# Patient Record
Sex: Male | Born: 1995 | Race: Black or African American | Hispanic: No | Smoking: Current every day smoker
Health system: Southern US, Community
[De-identification: ages and names within clinical notes are randomized; demographics above are authoritative.]

## PROBLEM LIST (undated history)

## (undated) DIAGNOSIS — N451 Epididymitis: Secondary | ICD-10-CM

## (undated) HISTORY — DX: Epididymitis: N45.1

## (undated) HISTORY — PX: HERNIA REPAIR: SHX51

---
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
## 2013-11-08 ENCOUNTER — Ambulatory Visit (INDEPENDENT_AMBULATORY_CARE_PROVIDER_SITE_OTHER): Payer: 59 | Admitting: Family Medicine

## 2013-11-08 ENCOUNTER — Ambulatory Visit: Payer: 59

## 2013-11-08 DIAGNOSIS — M542 Cervicalgia: Secondary | ICD-10-CM

## 2013-11-08 DIAGNOSIS — Z7251 High risk heterosexual behavior: Secondary | ICD-10-CM

## 2013-11-08 DIAGNOSIS — M545 Low back pain, unspecified: Secondary | ICD-10-CM

## 2013-11-08 DIAGNOSIS — R369 Urethral discharge, unspecified: Secondary | ICD-10-CM

## 2013-11-08 DIAGNOSIS — M62838 Other muscle spasm: Secondary | ICD-10-CM

## 2013-11-08 DIAGNOSIS — S0093XA Contusion of unspecified part of head, initial encounter: Secondary | ICD-10-CM

## 2013-11-08 DIAGNOSIS — M62830 Muscle spasm of back: Secondary | ICD-10-CM

## 2013-11-08 LAB — RPR

## 2013-11-08 LAB — HIV ANTIBODY (ROUTINE TESTING W REFLEX): HIV: NONREACTIVE

## 2013-11-08 MED ORDER — CYCLOBENZAPRINE HCL 5 MG PO TABS: ORAL_TABLET | ORAL | Status: DC

## 2013-11-08 MED ORDER — AZITHROMYCIN 250 MG PO TABS: 1000.0000 mg | ORAL_TABLET | Freq: Once | ORAL | Status: DC

## 2013-11-08 MED ORDER — CEFTRIAXONE SODIUM 1 G IJ SOLR
250.0000 mg | Freq: Once | INTRAMUSCULAR | Status: AC
  Administered 2013-11-08: 250 mg via INTRAMUSCULAR

## 2013-11-08 MED ORDER — MELOXICAM 7.5 MG PO TABS: 7.5000 mg | ORAL_TABLET | Freq: Every day | ORAL | Status: DC | PRN
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
## 2013-11-09 LAB — GC/CHLAMYDIA PROBE AMP
CT PROBE, AMP APTIMA: NEGATIVE
GC PROBE AMP APTIMA: POSITIVE — AB

## 2014-02-22 ENCOUNTER — Encounter (HOSPITAL_COMMUNITY): Payer: Self-pay | Admitting: Emergency Medicine

## 2014-02-22 ENCOUNTER — Emergency Department (HOSPITAL_COMMUNITY)
Admission: EM | Admit: 2014-02-22 | Discharge: 2014-02-22 | Disposition: A | Discharge status: Home / Self Care | Payer: 59 | Attending: Emergency Medicine | Admitting: Emergency Medicine

## 2014-02-22 DIAGNOSIS — IMO0002 Reserved for concepts with insufficient information to code with codable children: Secondary | ICD-10-CM | POA: Insufficient documentation

## 2014-02-22 DIAGNOSIS — R21 Rash and other nonspecific skin eruption: Secondary | ICD-10-CM | POA: Insufficient documentation

## 2014-02-22 DIAGNOSIS — Z792 Long term (current) use of antibiotics: Secondary | ICD-10-CM | POA: Insufficient documentation

## 2014-02-22 DIAGNOSIS — Z791 Long term (current) use of non-steroidal anti-inflammatories (NSAID): Secondary | ICD-10-CM | POA: Insufficient documentation

## 2014-02-22 MED ORDER — PREDNISONE 20 MG PO TABS: ORAL_TABLET | ORAL | Status: DC

## 2014-02-22 NOTE — ED Notes (Signed)
Patient with over week long history of rash on torso, back, legs and upper arms.  Patient states that it only itches when his skin is dry and lotion usually tames the itching.

## 2014-02-22 NOTE — Discharge Instructions (Signed)
Please call your doctor for a followup appointment within 24-48 hours. When you talk to your doctor please let them know that you were seen in the emergency department and have them acquire all of your records so that they can discuss the findings with you and formulate a treatment plan to fully care for your new and ongoing problems. Please call and set-up an appointment with [facility] Please call and set-up an appointment with Dermatologist Please rest and stay hydrated  Please drink plenty of water Please continue to monitor symptoms closely and if symptoms are to worsen or change (fever greater than 101, chills, chest pain, shortness of breath, difficulty breathing, worsening changes to rash, neck pain, neck stiffness, throat closing sensation, sore throat, difficulty swallowing, blurred vision, sudden loss of vision, spreading or worsening of rash, rash to the palms of the hands and soles of the feet) please report back to the ED immediately   Rash A rash is a change in the color or texture of your skin. There are many different types of rashes. You may have other problems that accompany your rash. CAUSES   Infections.  Allergic reactions. This can include allergies to pets or foods.  Certain medicines.  Exposure to certain chemicals, soaps, or cosmetics.  Heat.  Exposure to poisonous plants.  Tumors, both cancerous and noncancerous. SYMPTOMS   Redness.  Scaly skin.  Itchy skin.  Dry or cracked skin.  Bumps.  Blisters.  Pain. DIAGNOSIS  Your caregiver may do a physical exam to determine what type of rash you have. A skin sample (biopsy) may be taken and examined under a microscope. TREATMENT  Treatment depends on the type of rash you have. Your caregiver may prescribe certain medicines. For serious conditions, you may need to see a skin doctor (dermatologist). HOME CARE INSTRUCTIONS   Avoid the substance that caused your rash.  Do not scratch your  rash. This can cause infection.  You may take cool baths to help stop itching.  Only take over-the-counter or prescription medicines as directed by your caregiver.  Keep all follow-up appointments as directed by your caregiver. SEEK IMMEDIATE MEDICAL CARE IF:  You have increasing pain, swelling, or redness.  You have a fever.  You have new or severe symptoms.  You have body aches, diarrhea, or vomiting.  Your rash is not better after 3 days. MAKE SURE YOU:  Understand these instructions.  Will watch your condition.  Will get help right away if you are not doing well or get worse. Document Released: 08/31/2002 Document Revised: 12/03/2011 Document Reviewed: 06/25/2011 [facility] Patient Information 2014 [place], [place].   Emergency Department Resource Guide 1) Find a Doctor and Pay Out of Pocket Although you won't have to find out who is covered by your insurance plan, it is a good idea to ask around and get recommendations. You will then need to call the office and see if the doctor you have chosen will accept you as a new patient and what types of options they offer for patients who are self-pay. Some doctors offer discounts or will set up payment plans for their patients who do not have insurance, but you will need to ask so you aren't surprised when you get to your appointment.  2) Contact Your Local Health Department Not all health departments have doctors that can see patients for sick visits, but many do, so it is worth a call to see if yours does. If you don't know where your local  health department is, you can check in your phone book. The CDC also has a tool to help you locate your state's health department, and many state websites also have listings of all of their local health departments.  3) Find a Walk-in Clinic If your illness is not likely to be very severe or complicated, you may want to try a walk in clinic. These are popping up all over the country in pharmacies,  drugstores, and shopping centers. They're usually staffed by nurse practitioners or physician assistants that have been trained to treat common illnesses and complaints. They're usually fairly quick and inexpensive. However, if you have serious medical issues or chronic medical problems, these are probably not your best option.  No Primary Care Doctor: - Call Health Connect at  [phone] - they can help you locate a primary care doctor that  accepts your insurance, provides certain services, etc. - Physician Referral Service- [phone]  Chronic Pain Problems: Organization         Address  Phone   Notes  [facility]  [phone] Patients need to be referred by their primary care doctor.   Medication Assistance: Organization         Address  Phone   Notes  [facility] [address] [place], [place] [address] [phone] --Must be a resident of [facility] -- Must have NO insurance coverage whatsoever (no Medicaid/ Medicare, etc.) -- The pt. MUST have a primary care doctor that directs their care regularly and follows them in the community   [facility]ist  [phone]) [phone]   [employer]  [phone]    Agencies that provide inexpensive medical care: Organization         Address  Phone   Notes  [facility]  [phone]) [phone]   [facility]    [phone]) [phone]   [facility] [address] [place], [place] [address] [phone]) [phone]   [facility] [address], [place] [phone]   Planned Parenthood    [phone]   [facility]    [phone]   [facility]  [address], [place] Phone:  [phone]) [phone], Fax:  [phone] Hours of Operation:  9 am - 6 pm, M-F.  Also accepts Medicaid/Medicare and self-pay.  [facility]  [address], [place] Phone:  [phone](336) [phone], Fax: [phone]) [phone]. Hours of Operation:  8:30 am - 5:30 pm, M-F.  Also accepts Medicaid and self-pay.  [facility] [place] [address], [place] Phone: [phone]) [phone]   [facility] [address], [place], [place] [phone], Ext. [phone] Mondays & Thursdays: 7-9 AM.  First 15 patients are seen on a first come, first serve basis.    Medicaid-accepting [facility] Providers:  Organization         Address  Phone   Notes  [facility] [address], [place] [phone] Also accepts self-pay patients.  [facility] [address], [place]  [phone]) [phone]   [facility] [address], [place] [phone]) [phone]   [facility] [address] [place] [address], [place] [phone]) [phone]   Renaye RakersVeita Bland [address], [place]   [phone] Only accepts [place] Access [place] patients after they  have their name applied to their card.   Self-Pay (no insurance) in [facility]:  Organization         Address  Phone   Notes  Sickle Cell Patients, [facility] [address] [place], [place] [phone]   [facility] [address] [place], [place] [phone]   [facility] [place]  [address], [place] [phone]   [facility]/Dr. Osei-Bonsu  [address] [place] or [address], [place] [phone] Phone number for both [place] and [place] locations is the same.  Urgent Medical and [facility] [address] [place] [phone]   [facility] [address], [place] or [address] [phone]   [facility] [address], [place] [phone], phone; [phone], fax Sees patients 1st and 3rd Saturday of every month.  Must not qualify for public  or private insurance (i.e. Medicaid, Medicare, [place] Health Choice, Veterans' Benefits)  Household income should be no more than 200% of the poverty level The clinic cannot treat you if you are pregnant or think you are pregnant  Sexually transmitted diseases are not treated at the clinic.    Dental Care: Organization         Address  Phone  Notes  [facility] Department of [facility] [address] [place], [place] [phone] Accepts children up to age 46 who are enrolled in [place] or [place] Health Choice; pregnant women with a Medicaid card; and children who have applied for Medicaid or [place] Health Choice, but were declined, whose parents can pay a reduced fee at time of service.  [facility] Department of [facility]  [address], [place] [phone] Accepts children up to age 7 who are enrolled in [place] or [place] Health Choice; pregnant women with a Medicaid card; and children who have applied for Medicaid or  Health Choice, but were declined, whose parents can pay a reduced fee at time of service.  [facility]  [address] [place], [place] [phone] Patients are seen by appointment only. Walk-ins are not accepted. [facility] will see patients 84 years of age and older. Monday - Tuesday (8am-5pm) Most Wednesdays (8:30-5pm) $30 per visit, cash only  [facility] Access PRO[facility]  [address], [facility] [phone] Patients are seen by appointment only. Walk-ins are not accepted. [facility] will see patients 42 years of age and older. One Wednesday Evening (Monthly: Volunteer Based).  $30 per visit, cash only  [employer]  [phone] for adults; Children under age 66, call Graduate Pediatric Dentistry at [phone]. Children aged 26-14, please call [phone] to request a pediatric application.  Dental services are provided in all areas of  dental care including fillings, crowns and bridges, complete and partial dentures, implants, gum treatment, root canals, and extractions. Preventive care is also provided. Treatment is provided to both adults and children. Patients are selected via a lottery and there is often a waiting list.   [facility] [address] [place]  [phone] [email or web address]   [facility] [address] [place], [place] [phone], Ext. [phone] Second and Fourth Thursday of each month, opens at 6:30 AM; Clinic ends at 9 AM.  Patients are seen  on a first-come first-served basis, and a limited number are seen during each clinic.   [facility]  [address] Ether [place]ins [place], [place] [phone]   Eligibility Requirements You must have lived in [place], [place], or [place] counties for at least the last three months.   You cannot be eligible for state or federal sponsored [place], including [employer], [place], or [employer].   You generally cannot be eligible for healthcare insurance through your employer.    How to apply: Eligibility screenings are held every Tuesday and Wednesday afternoon from 1:00 pm until 4:00 pm. You do not need an appointment for the interview!  [facility] [address] [place], [place] [phone]   [facility] Health Department  [phone]   [facility] Health Department  [phone]   [facility]  [phone]    Behavioral Health Resources in the Community: Intensive Outpatient Programs Organization         Address  Phone  Notes  [facility] Services [address] [place], [place] [phone]   [facility] Outpatient [address] [place], [place] [phone]   [facility] [address], [place], [place]  [phone]   [facility] [address]  [place], [place] [phone] or  [phone]   Substance Abuse Resources Organization         Address  Phone  Notes  [facility] Drug Services  [phone]   [facility]  [phone]   [facility]  [phone]   Floydene Flock  [phone]   Residential & Outpatient Substance Abuse Program  [phone]   Psychological Services Organization         Address  Phone  Notes  [facility]  [phone]   [facility] Services  [phone]   [facility] [address], [place] [phone] or [phone]    Mobile Crisis Teams Organization         Address  Phone  Notes  [facility], Mobile Crisis Care Unit  [phone]   [facility]  [address] [place], [place] [phone]   Doristine Locks [address] [place] [phone]    Self-Help/Support Groups Organization         Address  Phone             Notes  [facility] [place] - variety of support groups  [phone]- I7437963 Call for more information  Narcotics Anonymous (NA), [facility] [address], [employer] [place]  2 meetings at this location   Statistician         Address  Phone  Notes  [facility] [address],    [place]  [phone]   [facility]  [address] [place] [address]2409, [place], [place] [phone]   [facility] [address] [place], [place] [phone] Admissions: 8am-3pm M-F  [facility] [address],    [place], [place] [phone]   [facility] [address] [place], [place] [phone]   [facility] [address]  [place], [place] [phone]   [facility] - Intensive Outpatient [address], [place], [place] [phone]   [facility] ([facility] [address] [place]  [place], [place] [phone] or [phone]   Residential  Treatment Services (RTS) [address] [place],  [place] [phone] Accepts Medicaid  [facility] [place] [address],  [place] [phone] Substance Abuse/Addiction Treatment   [facility] Organization         Address  Phone  Notes  [facility]  [phone]   Domenic Schwab, PhD [address]lis [place], [place]   [phone] or [phone]   [address] [place], [place] [phone]   [address] [place], [place] [phone] Insurance/Medicaid/sponsorship through [facility] [address] [place]                                    [place], [place] [phone] [place] [address], [place] [phone]    Dr. Adele Schilder  [phone]   [facility] Dept. 1) [address] [place] 2) [place] 3)  [place], [place] [phone]  [phone]   [place] [phone] or [phone] (After Hours)

## 2014-02-22 NOTE — ED Provider Notes (Signed)
CSN: [id number]     Arrival date & time 02/22/14  2014 History  This chart was scribed for a non-physician practitioner, Jamse Mead PA-C, working with Veryl Speak, MD by [place] Peace, ED Scribe. The patient was seen in TR07C/TR07C. The patient's care was started at 10:11 PM.  Chief Complaint  Patient presents with  . Rash    The history is provided by the patient. No language interpreter was used.   HPI Comments: Martin Frye is a 18 y.o. male who presents to the Emergency Department complaining of a gradual onset, worsening, pruritic rash that started last Monday. Pt states that the rash has started on his upper legs, then spread to his torso, then arms. He states that rash typically only itches when his skin is dry. He denies drainage or bleeding from rash areas. He does not have any rash on the palms or soles of his feet. He has been applying Vaseline lotion to his skin which improves itching. He also states that he switched to an Old Spice body wash about two weeks ago. He denies changes in fabric softener or colognes.  He has not been swimming in pools recently. He denies fever, CP, SOB, difficulty breathing, facial swelling, tongue swelling, throat closing sensation, sore throat, dysphagia, nausea or vomiting. He states that no one who lives with him has developed a similar rash. He reports that he moved to the area 1 month ago. He has no history of hypertension or DM.    No past medical history on file. Past Surgical History  Procedure Laterality Date  . Hernia repair     No family history on file. History  Substance Use Topics  . Smoking status: Never Smoker   . Smokeless tobacco: Not on file  . Alcohol Use: No    Review of Systems  Constitutional: Negative for fever.  HENT: Negative for sore throat and trouble swallowing.   Respiratory: Negative for shortness of breath.   Cardiovascular: Negative for chest pain.  Gastrointestinal: Negative for nausea and vomiting.   Skin: Positive for rash.      Allergies  Review of patient's allergies indicates no known allergies.  Home Medications   Prior to Admission medications   Medication Sig Start Date End Date Taking? Authorizing Provider  azithromycin (ZITHROMAX) 250 MG tablet Take 4 tablets (1,000 mg total) by mouth once. 11/08/13   Wendie Agreste, MD  cyclobenzaprine (FLEXERIL) 5 MG tablet 1 pill by mouth up to every 8 hours as needed. Start with one pill by mouth each bedtime as needed due to sedation 11/08/13   Wendie Agreste, MD  meloxicam (MOBIC) 7.5 MG tablet Take 1-2 tablets (7.5-15 mg total) by mouth daily as needed for pain. 11/08/13   Wendie Agreste, MD  predniSONE (DELTASONE) 20 MG tablet 3 tabs po day one, then 2 tabs daily x 4 days 02/22/14   Jamse Mead, PA-C   Triage Vitals: BP 134/76  Pulse 72  Temp(Src) 98.4 F (36.9 C) (Oral)  Resp 15  SpO2 97% Physical Exam  Nursing note and vitals reviewed. Constitutional: He is oriented to person, place, and time. He appears well-developed and well-nourished. No distress.  HENT:  Head: Normocephalic and atraumatic.  Mouth/Throat: Oropharynx is clear and moist. No oropharyngeal exudate.  Negative swelling, erythema, inflammation, lesions, sores, petechiae, exudate identified to the posterior oropharynx and bilateral tonsils. Negative trismus. Uvula midline with symmetrical elevation. Negative uvula deviation.  Eyes: Conjunctivae and EOM are normal. Pupils are equal,  round, and reactive to light. Right eye exhibits no discharge. Left eye exhibits no discharge.  Neck: Normal range of motion. Neck supple. No tracheal deviation present.  Negative neck stiffness Negative nuchal rigidity Negative cervical lymphadenopathy Negative meningeal signs  Cardiovascular: Normal rate, regular rhythm and normal heart sounds.  Exam reveals no friction rub.   No murmur heard. Pulses:      Radial pulses are 2+ on the right side, and 2+ on the left side.   Pulmonary/Chest: Effort normal and breath sounds normal. No respiratory distress. He has no wheezes. He has no rales.  Musculoskeletal: Normal range of motion.  Full ROM to upper and lower extremities without difficulty noted, negative ataxia noted.  Lymphadenopathy:    He has no cervical adenopathy.  Neurological: He is alert and oriented to person, place, and time. No cranial nerve deficit. He exhibits normal muscle tone. Coordination normal.  Skin: Skin is warm and dry. Rash noted. He is not diaphoretic.  Small papular rash identified to the arms bilaterally, upper thighs bilaterally, chest, abdomen, back. Negative active drainage or bleeding noted. Negative blood spaces. Negative rashes noted to the palms of the hands.  Psychiatric: He has a normal mood and affect. His behavior is normal. Thought content normal.    ED Course  Procedures (including critical care time) DIAGNOSTIC STUDIES: Oxygen Saturation is 97% on room air, normal by my interpretation.    COORDINATION OF CARE: 10:20 PM- Treatment plan was discussed with patient who verbalizes understanding and agrees.     Labs Review Labs Reviewed - No data to display  Imaging Review No results found.   EKG Interpretation None      MDM   Final diagnoses:  Rash and nonspecific skin eruption    Filed Vitals:   02/22/14 2021  BP: 134/76  Pulse: 72  Temp: 98.4 F (36.9 C)  TempSrc: Oral  Resp: 15  SpO2: 97%   I personally performed the services described in this documentation, which was scribed in my presence. The recorded information has been reviewed and is accurate.  Doubt erythema multiforme major and minor. Doubt SJS. Doubt varicella. Doubt anaphylactic reaction. Doubt scabies. Suspicion to be possible dermatitis-definitive etiology or form of rash is unknown. Patient stable, afebrile. Patient not septic appearing. Discharged patient. Discharged patient with prednisone. Discussed with patient to rest and stay  hydrated. Discussed with patient to do warm soaks. Recommended patient to discontinue old spice soap. Referred patient to health and wellness Center and dermatologist. Discussed with patient to closely monitor symptoms and if symptoms are to worsen or change to report back to the ED - strict return instructions given.  Patient agreed to plan of care, understood, all questions answered.   Jamse Mead, PA-C 02/22/14 2238

## 2014-02-23 NOTE — ED Provider Notes (Signed)
Medical screening examination/treatment/procedure(s) were performed by non-physician practitioner and as supervising physician I was immediately available for consultation/collaboration.     Vishwa Dais, MD 02/23/14 0025 

## 2014-04-07 DIAGNOSIS — Z0271 Encounter for disability determination: Secondary | ICD-10-CM

## 2014-05-26 ENCOUNTER — Encounter (HOSPITAL_COMMUNITY): Payer: Self-pay | Admitting: Emergency Medicine

## 2014-05-26 ENCOUNTER — Emergency Department (HOSPITAL_COMMUNITY)
Admission: EM | Admit: 2014-05-26 | Discharge: 2014-05-26 | Disposition: A | Discharge status: Home / Self Care | Payer: 59 | Attending: Emergency Medicine | Admitting: Emergency Medicine

## 2014-05-26 DIAGNOSIS — M546 Pain in thoracic spine: Secondary | ICD-10-CM | POA: Insufficient documentation

## 2014-05-26 DIAGNOSIS — Z792 Long term (current) use of antibiotics: Secondary | ICD-10-CM | POA: Diagnosis not present

## 2014-05-26 DIAGNOSIS — G8911 Acute pain due to trauma: Secondary | ICD-10-CM | POA: Diagnosis not present

## 2014-05-26 DIAGNOSIS — M542 Cervicalgia: Secondary | ICD-10-CM | POA: Diagnosis present

## 2014-05-26 DIAGNOSIS — M545 Low back pain, unspecified: Secondary | ICD-10-CM

## 2014-05-26 DIAGNOSIS — M62830 Muscle spasm of back: Secondary | ICD-10-CM

## 2014-05-26 DIAGNOSIS — IMO0002 Reserved for concepts with insufficient information to code with codable children: Secondary | ICD-10-CM | POA: Diagnosis not present

## 2014-05-26 DIAGNOSIS — M25519 Pain in unspecified shoulder: Secondary | ICD-10-CM | POA: Diagnosis not present

## 2014-05-26 DIAGNOSIS — M62838 Other muscle spasm: Secondary | ICD-10-CM

## 2014-05-26 DIAGNOSIS — S299XXD Unspecified injury of thorax, subsequent encounter: Secondary | ICD-10-CM

## 2014-05-26 MED ORDER — CYCLOBENZAPRINE HCL 5 MG PO TABS: ORAL_TABLET | ORAL | Status: DC

## 2014-05-26 MED ORDER — MELOXICAM 7.5 MG PO TABS: 7.5000 mg | ORAL_TABLET | Freq: Every day | ORAL | Status: DC | PRN
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
## 2014-05-26 NOTE — ED Notes (Signed)
Neck, shoulder, upper back pain. Worse when he gets up from sleeping. Lift weights when working out. Usually does sit ups.
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
## 2014-05-31 NOTE — ED Provider Notes (Signed)
Medical screening examination/treatment/procedure(s) were performed by non-physician practitioner and as supervising physician I was immediately available for consultation/collaboration.   EKG Interpretation None       Raeford Razor, MD 05/31/14 [phone]

## 2018-01-07 ENCOUNTER — Encounter (HOSPITAL_COMMUNITY): Payer: Self-pay | Admitting: Emergency Medicine

## 2018-01-07 DIAGNOSIS — Y929 Unspecified place or not applicable: Secondary | ICD-10-CM | POA: Insufficient documentation

## 2018-01-07 DIAGNOSIS — Z79899 Other long term (current) drug therapy: Secondary | ICD-10-CM | POA: Insufficient documentation

## 2018-01-07 DIAGNOSIS — Y939 Activity, unspecified: Secondary | ICD-10-CM | POA: Insufficient documentation

## 2018-01-07 DIAGNOSIS — Y999 Unspecified external cause status: Secondary | ICD-10-CM | POA: Insufficient documentation

## 2018-01-07 DIAGNOSIS — M549 Dorsalgia, unspecified: Secondary | ICD-10-CM | POA: Insufficient documentation
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
## 2018-01-08 ENCOUNTER — Emergency Department (HOSPITAL_COMMUNITY)
Admission: EM | Admit: 2018-01-08 | Discharge: 2018-01-08 | Disposition: A | Discharge status: Home / Self Care | Payer: Commercial Managed Care - PPO | Attending: Emergency Medicine | Admitting: Emergency Medicine

## 2018-01-08 DIAGNOSIS — M7918 Myalgia, other site: Secondary | ICD-10-CM

## 2018-01-08 MED ORDER — METHOCARBAMOL 500 MG PO TABS: 500.0000 mg | ORAL_TABLET | Freq: Two times a day (BID) | ORAL | 0 refills | Status: DC

## 2018-01-08 MED ORDER — IBUPROFEN 600 MG PO TABS: 600.0000 mg | ORAL_TABLET | Freq: Four times a day (QID) | ORAL | 0 refills | Status: DC | PRN
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
## 2018-01-08 NOTE — ED Notes (Signed)
Bed: WA08 Expected date:  Expected time:  Means of arrival:  Comments: 

## 2018-09-27 ENCOUNTER — Emergency Department (HOSPITAL_COMMUNITY)
Admission: EM | Admit: 2018-09-27 | Discharge: 2018-09-27 | Disposition: A | Discharge status: Home / Self Care | Payer: PRIVATE HEALTH INSURANCE | Attending: Emergency Medicine | Admitting: Emergency Medicine

## 2018-09-27 ENCOUNTER — Encounter (HOSPITAL_COMMUNITY): Payer: Self-pay

## 2018-09-27 ENCOUNTER — Other Ambulatory Visit: Payer: Self-pay

## 2018-09-27 DIAGNOSIS — K047 Periapical abscess without sinus: Secondary | ICD-10-CM | POA: Insufficient documentation

## 2018-09-27 DIAGNOSIS — K0889 Other specified disorders of teeth and supporting structures: Secondary | ICD-10-CM | POA: Diagnosis present

## 2018-09-27 MED ORDER — HYDROCODONE-ACETAMINOPHEN 5-325 MG PO TABS: 1.0000 | ORAL_TABLET | Freq: Four times a day (QID) | ORAL | 0 refills | Status: DC | PRN

## 2018-09-27 MED ORDER — AMOXICILLIN 500 MG PO CAPS: 500.0000 mg | ORAL_CAPSULE | Freq: Three times a day (TID) | ORAL | 0 refills | Status: DC

## 2018-09-27 NOTE — ED Notes (Signed)
Pt wanting to leave due to wait time. Pt informed if he left, he needed to sign out.

## 2018-09-27 NOTE — Discharge Instructions (Signed)
You have been seen by your caregiver because of dental pain.  °SEEK MEDICAL ATTENTION IF: °The exam and treatment you received today has been provided on an emergency basis only. This is not a substitute for complete medical or dental care. If your problem worsens or new symptoms (problems) appear, and you are unable to arrange prompt follow-up care with your dentist, call or return to this location. °CALL YOUR DENTIST OR RETURN IMMEDIATELY IF you develop a fever, rash, difficulty breathing or swallowing, neck or facial swelling, or other potentially serious concerns. ° ° °[employer]  °[employer]  °[facility]  °[address]  °[place], [place] [address]  °Phone [phone]  °[facility] [employer] [facility] [place], [place], exemplifies the Dental School?s vision to improve the health and quality of life of all [place]ians by creating leaders with a passion to care for the underserved and by leading the nation in community-based, service learning oral health education. °We are committed to offering comprehensive general dental services for adults, children and special needs patients in a safe, caring and professional setting. ° °Appointments: Our clinic is open Monday through Friday 8:00 a.m. until 5:00 p.m. The amount of time scheduled for an appointment depends on the patient?s specific needs. We ask that you keep your appointed time for care or provide 24-hour notice of all appointment changes. Parents or legal guardians must accompany minor children. ° °Payment for Services: Medicaid and other insurance plans are welcome. Payment for services is due when services are rendered and may be made by cash or credit card. If you have dental insurance, we will assist you with your claim submission.  ° °Emergencies: Emergency services will be provided Monday through Friday on a  walk-in basis. Please arrive early for emergency services. After hours emergency services will be provided for patients of record as required. ° °Services:  °Comprehensive General Dentistry  °Children?s Dentistry  °Oral Surgery - Extractions  °Root Canals  °Sealants and Tooth Colored Fillings  °Crowns and Bridges  °Dentures and Partial Dentures  °Implant Services  °Periodontal Services and Cleanings  °Cosmetic Tooth Whitening  °Digital Radiography  °3-D/[facility] Imaging ° ° °

## 2018-09-27 NOTE — ED Triage Notes (Signed)
Pt reports R lower dental pain x4 days. States that Tylenol and Orajel dont help.

## 2018-09-27 NOTE — ED Provider Notes (Signed)
[facility]-EMERGENCY DEPT Provider Note   CSN: [id number] Arrival date & time: 09/27/18  0230     History   Chief Complaint Chief Complaint  Patient presents with  . Dental Pain    HPI Martin Frye is a 23 y.o. male who presents with chief complaint of dental pain.  Patient states that he noticed pain and swelling in his right lower molars and along the jawline over the past 2 days.  Is been worsening and he has had significantly worsening swelling.  He has some pain in the right tonsillar lymph node but denies any difficulty swallowing, change in voice or trismus.  Patient also denies fever or chills.  HPI  History reviewed. No pertinent past medical history.  There are no active problems to display for this patient.   Past Surgical History:  Procedure Laterality Date  . HERNIA REPAIR          Home Medications    Prior to Admission medications   Medication Sig Start Date End Date Taking? Authorizing Provider  azithromycin (ZITHROMAX) 250 MG tablet Take 4 tablets (1,000 mg total) by mouth once. 11/08/13   Shade Flood, MD  cyclobenzaprine (FLEXERIL) 5 MG tablet 1 pill by mouth up to every 8 hours as needed. Start with one pill by mouth each bedtime as needed due to sedation 05/26/14   Earley Favor, NP  ibuprofen (ADVIL,MOTRIN) 600 MG tablet Take 1 tablet (600 mg total) by mouth every 6 (six) hours as needed. 01/08/18   Antony Madura, PA-C  meloxicam (MOBIC) 7.5 MG tablet Take 1-2 tablets (7.5-15 mg total) by mouth daily as needed for pain. 05/26/14   Earley Favor, NP  methocarbamol (ROBAXIN) 500 MG tablet Take 1 tablet (500 mg total) by mouth 2 (two) times daily. 01/08/18   Antony Madura, PA-C  predniSONE (DELTASONE) 20 MG tablet 3 tabs po day one, then 2 tabs daily x 4 days 02/22/14   Raymon Mutton, PA-C    Family History History reviewed. No pertinent family history.  Social History Social History   Tobacco Use  . Smoking status: Never Smoker   Substance Use Topics  . Alcohol use: No  . Drug use: No     Allergies   Patient has no known allergies.   Review of Systems Review of Systems Ten systems reviewed and are negative for acute change, except as noted in the HPI.    Physical Exam Updated Vital Signs BP 133/85 (BP Location: Right Arm)   Pulse 74   Temp 98.6 F (37 C) (Oral)   Resp 16   SpO2 100%   Physical Exam Vitals signs and nursing note reviewed.  Constitutional:      General: He is not in acute distress.    Appearance: He is well-developed. He is not diaphoretic.  HENT:     Head: Normocephalic and atraumatic.     Mouth/Throat:     Comments: Significant dental decay.  Swelling along the lower jawline.  No obvious fluctuance or dental abscess. Eyes:     General: No scleral icterus.    Conjunctiva/sclera: Conjunctivae normal.  Neck:     Musculoskeletal: Normal range of motion and neck supple.  Cardiovascular:     Rate and Rhythm: Normal rate and regular rhythm.     Heart sounds: Normal heart sounds.  Pulmonary:     Effort: Pulmonary effort is normal. No respiratory distress.     Breath sounds: Normal breath sounds.  Abdominal:  Palpations: Abdomen is soft.     Tenderness: There is no abdominal tenderness.  Skin:    General: Skin is warm and dry.  Neurological:     Mental Status: He is alert.  Psychiatric:        Behavior: Behavior normal.      ED Treatments / Results  Labs (all labs ordered are listed, but only abnormal results are displayed) Labs Reviewed - No data to display  EKG None  Radiology No results found.  Procedures Procedures (including critical care time)  Medications Ordered in ED Medications - No data to display   Initial Impression / Assessment and Plan / ED Course  I have reviewed the triage vital signs and the nursing notes.  Pertinent labs & imaging results that were available during my care of the patient were reviewed by me and considered in my  medical decision making (see chart for details).     Patient with dental infection.  Discharged with pain medications and amoxicillin.  Follow-up with dentist.  Discussed return precautions.  No signs of [place]'s angina. PDMP reviewed during this encounter.   Final Clinical Impressions(s) / ED Diagnoses   Final diagnoses:  Dental abscess    ED Discharge Orders    None       Arthor Captain, PA-C 09/27/18 2025    Raeford Razor, MD 09/27/18 2339

## 2020-08-05 ENCOUNTER — Ambulatory Visit (HOSPITAL_COMMUNITY)
Admission: EM | Admit: 2020-08-05 | Discharge: 2020-08-05 | Disposition: A | Discharge status: Home / Self Care | Payer: PRIVATE HEALTH INSURANCE

## 2020-08-05 ENCOUNTER — Emergency Department (HOSPITAL_COMMUNITY): Payer: PRIVATE HEALTH INSURANCE

## 2020-08-05 ENCOUNTER — Encounter (HOSPITAL_COMMUNITY): Payer: Self-pay | Admitting: Emergency Medicine

## 2020-08-05 ENCOUNTER — Other Ambulatory Visit: Payer: Self-pay

## 2020-08-05 ENCOUNTER — Emergency Department (HOSPITAL_COMMUNITY)
Admission: EM | Admit: 2020-08-05 | Discharge: 2020-08-06 | Disposition: A | Discharge status: Home / Self Care | Payer: PRIVATE HEALTH INSURANCE | Attending: Emergency Medicine | Admitting: Emergency Medicine

## 2020-08-05 DIAGNOSIS — N451 Epididymitis: Secondary | ICD-10-CM

## 2020-08-05 DIAGNOSIS — N50811 Right testicular pain: Secondary | ICD-10-CM | POA: Diagnosis not present

## 2020-08-05 DIAGNOSIS — N5089 Other specified disorders of the male genital organs: Secondary | ICD-10-CM | POA: Diagnosis not present

## 2020-08-05 DIAGNOSIS — R1031 Right lower quadrant pain: Secondary | ICD-10-CM

## 2020-08-05 DIAGNOSIS — R52 Pain, unspecified: Secondary | ICD-10-CM

## 2020-08-05 DIAGNOSIS — N390 Urinary tract infection, site not specified: Secondary | ICD-10-CM

## 2020-08-05 LAB — URINALYSIS, ROUTINE W REFLEX MICROSCOPIC
Bilirubin Urine: NEGATIVE
Glucose, UA: NEGATIVE mg/dL
Hgb urine dipstick: NEGATIVE
Ketones, ur: NEGATIVE mg/dL
Nitrite: NEGATIVE
Protein, ur: 30 mg/dL — AB
Specific Gravity, Urine: 1.035 — ABNORMAL HIGH (ref 1.005–1.030)
WBC, UA: >50 WBC/hpf — ABNORMAL HIGH (ref 0–5)
pH: 5 (ref 5.0–8.0)

## 2020-08-05 LAB — CBC WITH DIFFERENTIAL/PLATELET
Abs Immature Granulocytes: 0.09 10*3/uL — ABNORMAL HIGH (ref 0.00–0.07)
Basophils Absolute: 0 10*3/uL (ref 0.0–0.1)
Basophils Relative: 0 %
Eosinophils Absolute: 0.2 10*3/uL (ref 0.0–0.5)
Eosinophils Relative: 1 %
HCT: 44.5 % (ref 39.0–52.0)
Hemoglobin: 14.8 g/dL (ref 13.0–17.0)
Immature Granulocytes: 1 %
Lymphocytes Relative: 21 %
Lymphs Abs: 2.8 10*3/uL (ref 0.7–4.0)
MCH: 27.1 pg (ref 26.0–34.0)
MCHC: 33.3 g/dL (ref 30.0–36.0)
MCV: 81.4 fL (ref 80.0–100.0)
Monocytes Absolute: 1.2 10*3/uL — ABNORMAL HIGH (ref 0.1–1.0)
Monocytes Relative: 9 %
Neutro Abs: 9.2 10*3/uL — ABNORMAL HIGH (ref 1.7–7.7)
Neutrophils Relative %: 68 %
Platelets: 372 10*3/uL (ref 150–400)
RBC: 5.47 MIL/uL (ref 4.22–5.81)
RDW: 13.2 % (ref 11.5–15.5)
WBC: 13.5 10*3/uL — ABNORMAL HIGH (ref 4.0–10.5)
nRBC: 0 % (ref 0.0–0.2)

## 2020-08-05 LAB — COMPREHENSIVE METABOLIC PANEL
ALT: 28 U/L (ref 0–44)
AST: 22 U/L (ref 15–41)
Albumin: 3.9 g/dL (ref 3.5–5.0)
Alkaline Phosphatase: 73 U/L (ref 38–126)
Anion gap: 11 (ref 5–15)
BUN: 6 mg/dL (ref 6–20)
CO2: 23 mmol/L (ref 22–32)
Calcium: 9.4 mg/dL (ref 8.9–10.3)
Chloride: 104 mmol/L (ref 98–111)
Creatinine, Ser: 0.98 mg/dL (ref 0.61–1.24)
GFR, Estimated: >60 mL/min (ref >60)
Glucose, Bld: 96 mg/dL (ref 70–99)
Potassium: 3.6 mmol/L (ref 3.5–5.1)
Sodium: 138 mmol/L (ref 135–145)
Total Bilirubin: 0.8 mg/dL (ref 0.3–1.2)
Total Protein: 7.8 g/dL (ref 6.5–8.1)

## 2020-08-05 MED ORDER — DOXYCYCLINE HYCLATE 100 MG PO TABS
100.0000 mg | ORAL_TABLET | Freq: Once | ORAL | Status: AC
  Administered 2020-08-05: 100 mg via ORAL
  Filled 2020-08-05: qty 1

## 2020-08-05 MED ORDER — HYDROCODONE-ACETAMINOPHEN 5-325 MG PO TABS: 1.0000 | ORAL_TABLET | Freq: Four times a day (QID) | ORAL | 0 refills | Status: DC | PRN

## 2020-08-05 MED ORDER — DOXYCYCLINE HYCLATE 100 MG PO CAPS: 100.0000 mg | ORAL_CAPSULE | Freq: Two times a day (BID) | ORAL | 0 refills | Status: DC

## 2020-08-05 MED ORDER — SODIUM CHLORIDE 0.9 % IV SOLN
1.0000 g | Freq: Once | INTRAVENOUS | Status: AC
  Administered 2020-08-05: 1 g via INTRAVENOUS
  Filled 2020-08-05: qty 10

## 2020-08-05 MED ORDER — MORPHINE SULFATE (PF) 4 MG/ML IV SOLN
4.0000 mg | Freq: Once | INTRAVENOUS | Status: AC
  Administered 2020-08-05: 4 mg via INTRAVENOUS
  Filled 2020-08-05: qty 1

## 2020-08-05 MED ORDER — SODIUM CHLORIDE 0.9 % IV BOLUS
1000.0000 mL | Freq: Once | INTRAVENOUS | Status: AC
  Administered 2020-08-05: 1000 mL via INTRAVENOUS

## 2020-08-05 NOTE — ED Notes (Signed)
US at bedside

## 2020-08-05 NOTE — ED Notes (Signed)
pts ride away

## 2020-08-05 NOTE — ED Triage Notes (Signed)
Pt c/o groin pain and swelling x 2 days. Pt states he has been feeling very warm and sweaty and has chills. Pt denies pain with urinating or penile discharge.

## 2020-08-05 NOTE — ED Notes (Signed)
Patient is being discharged from the Urgent Care Center and sent to the Emergency Department via wheelchair by staff. Per York Cerise, NP, patient is stable but in need of higher level of care due to testicle swelling. Patient is aware and verbalizes understanding of plan of care.  Vitals:   08/05/20 1845  BP: (!) 142/91  Pulse: 78  Resp: 17  Temp: 98.2 F (36.8 C)  SpO2: 100%

## 2020-08-05 NOTE — ED Provider Notes (Signed)
[facility] EMERGENCY DEPARTMENT Provider Note   CSN: [id number] Arrival date & time: 08/05/20  2113     History Chief Complaint  Patient presents with  . Testicle Pain    Martin Frye is a 24 y.o. male here with right testicular pain.  Patient states that he has right testicular pain for the last 3 to 4 days.  He states that he had a previous hernia repair on the right side.  States it progressively felt worse and more swollen.  Patient has some trouble urinating as well. Patient states that he is sexually active but denies any penile discharge or known STD exposure. Went to urgent care and sent for r/o torsion.   The history is provided by the patient.       History reviewed. No pertinent past medical history.  Patient Active Problem List   Diagnosis Date Noted  . Swelling of right testicle 08/05/2020  . Severe right groin pain 08/05/2020    Past Surgical History:  Procedure Laterality Date  . HERNIA REPAIR         No family history on file.  Social History   Tobacco Use  . Smoking status: Never Smoker  . Smokeless tobacco: Never Used  Substance Use Topics  . Alcohol use: No  . Drug use: No    Home Medications Prior to Admission medications   Medication Sig Start Date End Date Taking? Authorizing Provider  amoxicillin (AMOXIL) 500 MG capsule Take 1 capsule (500 mg total) by mouth 3 (three) times daily. Patient not taking: Reported on 08/05/2020 09/27/18   Arthor Captain, PA-C  azithromycin (ZITHROMAX) 250 MG tablet Take 4 tablets (1,000 mg total) by mouth once. Patient not taking: Reported on 08/05/2020 11/08/13   Shade Flood, MD  cyclobenzaprine (FLEXERIL) 5 MG tablet 1 pill by mouth up to every 8 hours as needed. Start with one pill by mouth each bedtime as needed due to sedation Patient not taking: Reported on 08/05/2020 05/26/14   Earley Favor, NP  HYDROcodone-acetaminophen (NORCO) 5-325 MG tablet Take 1-2 tablets by mouth every 6  (six) hours as needed for severe pain. Patient not taking: Reported on 08/05/2020 09/27/18   Arthor Captain, PA-C  ibuprofen (ADVIL,MOTRIN) 600 MG tablet Take 1 tablet (600 mg total) by mouth every 6 (six) hours as needed. Patient not taking: Reported on 08/05/2020 01/08/18   Antony Madura, PA-C  meloxicam (MOBIC) 7.5 MG tablet Take 1-2 tablets (7.5-15 mg total) by mouth daily as needed for pain. Patient not taking: Reported on 08/05/2020 05/26/14   Earley Favor, NP  methocarbamol (ROBAXIN) 500 MG tablet Take 1 tablet (500 mg total) by mouth 2 (two) times daily. Patient not taking: Reported on 08/05/2020 01/08/18   Antony Madura, PA-C  predniSONE (DELTASONE) 20 MG tablet 3 tabs po day one, then 2 tabs daily x 4 days Patient not taking: Reported on 08/05/2020 02/22/14   Raymon Mutton, PA-C    Allergies    Patient has no known allergies.  Review of Systems   Review of Systems  Genitourinary: Positive for testicular pain.  All other systems reviewed and are negative.   Physical Exam Updated Vital Signs BP 133/84 (BP Location: Right Arm)   Pulse 83   Temp 98.4 F (36.9 C) (Oral)   Resp 16   Ht 5\' 6"  (1.676 m)   Wt 90 kg   SpO2 98%   BMI 32.02 kg/m   Physical Exam Vitals and nursing note reviewed.  Constitutional:  Comments: Uncomfortable   HENT:     Head: Normocephalic.     Nose: Nose normal.     Mouth/Throat:     Mouth: Mucous membranes are moist.  Eyes:     Extraocular Movements: Extraocular movements intact.     Pupils: Pupils are equal, round, and reactive to light.  Cardiovascular:     Rate and Rhythm: Normal rate and regular rhythm.     Pulses: Normal pulses.     Heart sounds: Normal heart sounds.  Pulmonary:     Effort: Pulmonary effort is normal.     Breath sounds: Normal breath sounds.  Abdominal:     General: Abdomen is flat.     Palpations: Abdomen is soft.     Comments: No obvious palpable direct right femoral hernia.  Genitourinary:    Comments:  Testicle very swollen and tender.  Unable to obtain cremasteric reflex due to swelling.  No obvious cellulitis.  Musculoskeletal:        General: Normal range of motion.     Cervical back: Normal range of motion.  Skin:    General: Skin is warm.     Capillary Refill: Capillary refill takes less than 2 seconds.  Neurological:     General: No focal deficit present.     Mental Status: He is oriented to person, place, and time.  Psychiatric:        Mood and Affect: Mood normal.        Behavior: Behavior normal.     ED Results / Procedures / Treatments   Labs (all labs ordered are listed, but only abnormal results are displayed) Labs Reviewed  URINALYSIS, ROUTINE W REFLEX MICROSCOPIC - Abnormal; Notable for the following components:      Result Value   APPearance HAZY (*)    Specific Gravity, Urine 1.035 (*)    Protein, ur 30 (*)    Leukocytes,Ua LARGE (*)    WBC, UA >50 (*)    Bacteria, UA RARE (*)    All other components within normal limits  CBC WITH DIFFERENTIAL/PLATELET - Abnormal; Notable for the following components:   WBC 13.5 (*)    Neutro Abs 9.2 (*)    Monocytes Absolute 1.2 (*)    Abs Immature Granulocytes 0.09 (*)    All other components within normal limits  COMPREHENSIVE METABOLIC PANEL    EKG None  Radiology No results found.  Procedures Procedures (including critical care time)  Medications Ordered in ED Medications  cefTRIAXone (ROCEPHIN) 1 g in sodium chloride 0.9 % 100 mL IVPB (1 g Intravenous New Bag/Given 08/05/20 2238)  sodium chloride 0.9 % bolus 1,000 mL (1,000 mLs Intravenous New Bag/Given 08/05/20 2237)  morphine 4 MG/ML injection 4 mg (4 mg Intravenous Given 08/05/20 2239)    ED Course  I have reviewed the triage vital signs and the nursing notes.  Pertinent labs & imaging results that were available during my care of the patient were reviewed by me and considered in my medical decision making (see chart for details).    MDM  Rules/Calculators/A&P                         Martin Frye is a 24 y.o. male presenting with right testicular pain.  Right scrotal sac is very swollen.  Consider hernia versus torsion versus hydrocele versus varicocele.  Plan to get ultrasound of the scrotum and CBC and CMP and urinalysis.  11:39 PM Blood cell count is  13.  Patient does have epididymitis with a complex hydrocele.  UA showed UTI.  I asked him a little bit more about his sexual history.  He did recently had unprotected sex someone else.  He does not know if she has anything.  I did swab him for gonorrhea chlamydia.  She was given Rocephin for UTI which will cover gonorrhea.  Patient will be discharged home with doxycycline which can cover UTI as well as chlamydia.  Urine culture sent. Will have him follow up with urology outpatient.    Final Clinical Impression(s) / ED Diagnoses Final diagnoses:  Pain    Rx / DC Orders ED Discharge Orders    None       Charlynne Pander, MD 08/05/20 2341

## 2020-08-05 NOTE — Discharge Instructions (Addendum)
GO TO [facility] IMMEDIATELY FOR R/O TESTICULAR TORSION/HERNIA/ABSCESS

## 2020-08-05 NOTE — ED Provider Notes (Signed)
[facility]    CSN: [id number] Arrival date & time: 08/05/20  1727      History   Chief Complaint Chief Complaint  Patient presents with  . Groin Swelling  . Groin Pain    HPI Martin Frye is a 24 y.o. male.   24 yr old AA male pt presents to UC with cc of right groin/testicle pain x 2 days, pt reports feeling sweaty and hot. No treatment tried. Wide stance gait.  The history is provided by the patient. No language interpreter was used.  Groin Pain This is a new problem. The current episode started 2 days ago. The problem occurs constantly. The problem has been gradually worsening. The symptoms are aggravated by walking. Nothing relieves the symptoms. He has tried nothing for the symptoms.    History reviewed. No pertinent past medical history.  Patient Active Problem List   Diagnosis Date Noted  . Swelling of right testicle 08/05/2020  . Severe right groin pain 08/05/2020    Past Surgical History:  Procedure Laterality Date  . HERNIA REPAIR         Home Medications    Prior to Admission medications   Medication Sig Start Date End Date Taking? Authorizing Provider  amoxicillin (AMOXIL) 500 MG capsule Take 1 capsule (500 mg total) by mouth 3 (three) times daily. 09/27/18   Harris, Abigail, PA-C  azithromycin (ZITHROMAX) 250 MG tablet Take 4 tablets (1,000 mg total) by mouth once. 11/08/13   Shade Flood, MD  cyclobenzaprine (FLEXERIL) 5 MG tablet 1 pill by mouth up to every 8 hours as needed. Start with one pill by mouth each bedtime as needed due to sedation 05/26/14   Earley Favor, NP  HYDROcodone-acetaminophen (NORCO) 5-325 MG tablet Take 1-2 tablets by mouth every 6 (six) hours as needed for severe pain. 09/27/18   Harris, Cammy Copa, PA-C  ibuprofen (ADVIL,MOTRIN) 600 MG tablet Take 1 tablet (600 mg total) by mouth every 6 (six) hours as needed. 01/08/18   Antony Madura, PA-C  meloxicam (MOBIC) 7.5 MG tablet Take 1-2 tablets (7.5-15 mg total) by mouth  daily as needed for pain. 05/26/14   Earley Favor, NP  methocarbamol (ROBAXIN) 500 MG tablet Take 1 tablet (500 mg total) by mouth 2 (two) times daily. 01/08/18   Antony Madura, PA-C  predniSONE (DELTASONE) 20 MG tablet 3 tabs po day one, then 2 tabs daily x 4 days 02/22/14   Raymon Mutton, PA-C    Family History History reviewed. No pertinent family history.  Social History Social History   Tobacco Use  . Smoking status: Never Smoker  . Smokeless tobacco: Never Used  Substance Use Topics  . Alcohol use: No  . Drug use: No     Allergies   Patient has no known allergies.   Review of Systems Review of Systems  Constitutional: Positive for activity change and chills.  Genitourinary: Positive for scrotal swelling and testicular pain. Negative for difficulty urinating, discharge, dysuria, flank pain, frequency, genital sores, penile pain and penile swelling.  Musculoskeletal: Negative for back pain.  All other systems reviewed and are negative.    Physical Exam Triage Vital Signs ED Triage Vitals  Enc Vitals Group     BP 08/05/20 1845 (!) 142/91     Pulse Rate 08/05/20 1845 78     Resp 08/05/20 1845 17     Temp 08/05/20 1845 98.2 F (36.8 C)     Temp Source 08/05/20 1845 Oral  SpO2 08/05/20 1845 100 %     Weight --      Height --      Head Circumference --      Peak Flow --      Pain Score 08/05/20 1837 3     Pain Loc --      Pain Edu? --      Excl. in GC? --    No data found.  Updated Vital Signs BP (!) 142/91 (BP Location: Right Arm)   Pulse 78   Temp 98.2 F (36.8 C) (Oral)   Resp 17   SpO2 100%   Visual Acuity Right Eye Distance:   Left Eye Distance:   Bilateral Distance:    Right Eye Near:   Left Eye Near:    Bilateral Near:     Physical Exam Vitals and nursing note reviewed. Exam conducted with a chaperone present.  Constitutional:      General: He is in acute distress.    Genitourinary:    Comments: Right testicle + swelling,erythema,  right inguinal groin + severe TTP of genital area Neurological:     General: No focal deficit present.     Mental Status: He is alert.     GCS: GCS eye subscore is 4. GCS verbal subscore is 5. GCS motor subscore is 6.  Psychiatric:        Behavior: Behavior is cooperative.      UC Treatments / Results  Labs (all labs ordered are listed, but only abnormal results are displayed) Labs Reviewed - No data to display  EKG   Radiology No results found.  Procedures Procedures (including critical care time)  Medications Ordered in UC Medications - No data to display  Initial Impression / Assessment and Plan / UC Course  I have reviewed the triage vital signs and the nursing notes.  Pertinent labs & imaging results that were available during my care of the patient were reviewed by me and considered in my medical decision making (see chart for details).      Final Clinical Impressions(s) / UC Diagnoses   Final diagnoses:  Swelling of right testicle  Severe right groin pain     Discharge Instructions     GO TO [place] [facility] IMMEDIATELY FOR R/O TESTICULAR TORSION/HERNIA/ABSCESS    ED Prescriptions    None     PDMP not reviewed this encounter.   Clancy Gourd, NP 08/05/20 1859

## 2020-08-05 NOTE — Discharge Instructions (Addendum)
You have epididymitis and UTI   Take doxycycline twice daily for 10 days.  Take Vicodin for severe pain  See urology for follow up   You were tested for gonorrhea chlamydia and if it is positive, you will be called and your partner will need treatment  Return to ER if you have worse scrotal pain or swelling, fevers, trouble urinating.

## 2020-08-05 NOTE — ED Triage Notes (Signed)
Patient reports right testicular pain onset this week with mild swelling , denies dysuria or injury , no hematuria or discharge .

## 2020-08-07 LAB — URINE CULTURE: Culture: NO GROWTH

## 2020-08-08 LAB — GC/CHLAMYDIA PROBE AMP (~~LOC~~) NOT AT ARMC
Chlamydia: NEGATIVE
Comment: NEGATIVE
Comment: NORMAL
Neisseria Gonorrhea: POSITIVE — AB

## 2020-10-24 ENCOUNTER — Encounter: Payer: Self-pay | Admitting: Nurse Practitioner

## 2020-10-24 ENCOUNTER — Ambulatory Visit: Payer: 59 | Attending: Nurse Practitioner | Admitting: Nurse Practitioner

## 2020-10-24 ENCOUNTER — Other Ambulatory Visit: Payer: Self-pay

## 2020-10-24 VITALS — Ht 67.0 in | Wt 178.0 lb

## 2020-10-24 DIAGNOSIS — N50811 Right testicular pain: Secondary | ICD-10-CM

## 2020-10-24 NOTE — Progress Notes (Signed)
Virtual Visit via Telephone Note Due to national recommendations of social distancing due to COVID 19, telehealth visit is felt to be most appropriate for this patient at this time.  I discussed the limitations, risks, security and privacy concerns of performing an evaluation and management service by telephone and the availability of in person appointments. I also discussed with the patient that there may be a patient responsible charge related to this service. The patient expressed understanding and agreed to proceed.    I connected with Martin Frye on 10/24/20  at   1:50 PM EST  EDT by telephone and verified that I am speaking with the correct person using two identifiers.   Consent I discussed the limitations, risks, security and privacy concerns of performing an evaluation and management service by telephone and the availability of in person appointments. I also discussed with the patient that there may be a patient responsible charge related to this service. The patient expressed understanding and agreed to proceed.   Location of Patient: Private Residence   Location of Provider: [facility]    Persons participating in Telemedicine visit: Martin Denver FNP-BC YY Channing CMA Martin Frye    History of Present Illness: Telemedicine visit for: Establish Care  He was treated for epididymitis in November 2021.  Cytology showed gonorrhea.  Today patient states he was treated for gonorrhea however continues with slight pain on and off in the the right testicle. He denies testicular swelling, dysuria, hematuria, penile discharge or any other GU symptoms..  It is unclear whether his partner was treated for gonorrhea.  His mother is accompanying him during the telehealth phone visit today and they are requesting a referral to urology.  I would like to make sure he has not been reinfected prior to urology referral.  They are agreeable at this time  He has no  other issues or concerns today.  Denies any previous history of diabetes, hypertension or thyroid disorder.   Past Medical History:  Diagnosis Date  . Epididymitis     Past Surgical History:  Procedure Laterality Date  . HERNIA REPAIR      Family History  Problem Relation Age of Onset  . Hypertension Maternal Grandmother   . Diabetes Maternal Grandmother   . Diabetes Maternal Grandfather   . Hypertension Maternal Grandfather     Social History   Socioeconomic History  . Marital status: Single    Spouse name: Not on file  . Number of children: Not on file  . Years of education: Not on file  . Highest education level: Not on file  Occupational History  . Not on file  Tobacco Use  . Smoking status: Current Every Day Smoker    Types: Cigarettes  . Smokeless tobacco: Never Used  Substance and Sexual Activity  . Alcohol use: No  . Drug use: No  . Sexual activity: Yes    Birth control/protection: None  Other Topics Concern  . Not on file  Social History Narrative  . Not on file   Social Determinants of Health   Financial Resource Strain: Not on file  Food Insecurity: Not on file  Transportation Needs: Not on file  Physical Activity: Not on file  Stress: Not on file  Social Connections: Not on file     Observations/Objective: Awake, alert and oriented x 3   Review of Systems  Constitutional: Negative for fever, malaise/fatigue and weight loss.  HENT: Negative.  Negative for nosebleeds.  Eyes: Negative.  Negative for blurred vision, double vision and photophobia.  Respiratory: Negative.  Negative for cough and shortness of breath.   Cardiovascular: Negative.  Negative for chest pain, palpitations and leg swelling.  Gastrointestinal: Negative.  Negative for heartburn, nausea and vomiting.  Genitourinary:       See HPI  Musculoskeletal: Negative.  Negative for myalgias.  Neurological: Negative.  Negative for dizziness, focal weakness, seizures and headaches.   Psychiatric/Behavioral: Negative.  Negative for suicidal ideas.    Assessment and Plan: [name] was seen today for establish care.  Diagnoses and all orders for this visit:  Right testicular pain -     Urinalysis, Complete -     Urine cytology ancillary only     Follow Up Instructions Return for Physical.     I discussed the assessment and treatment plan with the patient. The patient was provided an opportunity to ask questions and all were answered. The patient agreed with the plan and demonstrated an understanding of the instructions.   The patient was advised to call back or seek an in-person evaluation if the symptoms worsen or if the condition fails to improve as anticipated.  I provided 11 minutes of non-face-to-face time during this encounter including median intraservice time, reviewing previous notes, labs, imaging, medications and explaining diagnosis and management.  Claiborne Rigg, FNP-BC

## 2020-10-25 ENCOUNTER — Other Ambulatory Visit: Payer: PRIVATE HEALTH INSURANCE

## 2020-10-25 LAB — URINE CYTOLOGY ANCILLARY ONLY
Bacterial Vaginitis-Urine: NEGATIVE
Candida Urine: NEGATIVE
Chlamydia: NEGATIVE
Comment: NEGATIVE
Comment: NEGATIVE
Comment: NORMAL
Neisseria Gonorrhea: NEGATIVE
Trichomonas: NEGATIVE

## 2020-10-25 LAB — URINALYSIS, COMPLETE
Bilirubin, UA: NEGATIVE
Glucose, UA: NEGATIVE
Leukocytes,UA: NEGATIVE
Nitrite, UA: NEGATIVE
Protein,UA: NEGATIVE
RBC, UA: NEGATIVE
Specific Gravity, UA: 1.027 (ref 1.005–1.030)
Urobilinogen, Ur: 0.2 mg/dL (ref 0.2–1.0)
pH, UA: 6 (ref 5.0–7.5)

## 2020-10-25 LAB — MICROSCOPIC EXAMINATION
Bacteria, UA: NONE SEEN
Casts: NONE SEEN /lpf
Epithelial Cells (non renal): NONE SEEN /hpf (ref 0–10)
WBC, UA: NONE SEEN /hpf (ref 0–5)

## 2020-10-31 ENCOUNTER — Telehealth: Payer: Self-pay | Admitting: Nurse Practitioner

## 2020-10-31 NOTE — Telephone Encounter (Signed)
Copied from CRM [id number]. Topic: General - Other >> Oct 31, 2020  4:02 PM Gwenlyn Fudge wrote: Reason for CRM: Pts mother called and is requesting to have lab orders placed for pt so that he can have labs done before his Cpe on 12/06/20. Please advise.

## 2020-11-01 NOTE — Telephone Encounter (Signed)
[facility] sent another CRM about same concern:   Reason for CRM: Patient called in to inquire of his labs that he say need to be done to check his testicles. Asking for a call when labs are placed Please call Ph# [phone]

## 2020-11-02 NOTE — Telephone Encounter (Signed)
Attempt to reach patient regarding his concern.  No answer and LVM.

## 2020-12-05 ENCOUNTER — Other Ambulatory Visit: Payer: Self-pay

## 2020-12-05 ENCOUNTER — Ambulatory Visit: Payer: 59 | Attending: Nurse Practitioner | Admitting: Nurse Practitioner

## 2020-12-05 ENCOUNTER — Encounter: Payer: Self-pay | Admitting: Nurse Practitioner

## 2020-12-05 VITALS — BP 121/76 | HR 95 | Resp 16 | Ht 67.0 in | Wt 198.0 lb

## 2020-12-05 DIAGNOSIS — Z0000 Encounter for general adult medical examination without abnormal findings: Secondary | ICD-10-CM

## 2020-12-05 MED ORDER — IBUPROFEN 600 MG PO TABS: 600.0000 mg | ORAL_TABLET | Freq: Four times a day (QID) | ORAL | 0 refills | Status: AC | PRN

## 2020-12-05 NOTE — Progress Notes (Signed)
Assessment & Plan:  Martin Frye was seen today for annual exam.  Diagnoses and all orders for this visit:  Encounter for annual physical exam Low back pain -     ibuprofen (ADVIL) 600 MG tablet; Take 1 tablet (600 mg total) by mouth every 6 (six) hours as needed.    Patient has been counseled on age-appropriate routine health concerns for screening and prevention. These are reviewed and up-to-date. Referrals have been placed accordingly. Immunizations are up-to-date or declined.    Subjective:   Chief Complaint  Patient presents with  . Annual Exam   HPI Martin Frye 25 y.o. male presents to office today for physical.  has a past medical history of Epididymitis (right testicle).   Review of Systems  Constitutional: Negative for fever, malaise/fatigue and weight loss.  HENT: Negative.  Negative for nosebleeds.   Eyes: Negative.  Negative for blurred vision, double vision and photophobia.  Respiratory: Negative.  Negative for cough and shortness of breath.   Cardiovascular: Negative.  Negative for chest pain, palpitations and leg swelling.  Gastrointestinal: Negative.  Negative for heartburn, nausea and vomiting.  Genitourinary: Negative.   Musculoskeletal: Negative.  Negative for myalgias.  Skin: Negative.   Neurological: Negative.  Negative for dizziness, focal weakness, seizures and headaches.  Endo/Heme/Allergies: Negative.   Psychiatric/Behavioral: Negative.  Negative for suicidal ideas.    Past Medical History:  Diagnosis Date  . Epididymitis     Past Surgical History:  Procedure Laterality Date  . HERNIA REPAIR      Family History  Problem Relation Age of Onset  . Hypertension Maternal Grandmother   . Diabetes Maternal Grandmother   . Diabetes Maternal Grandfather   . Hypertension Maternal Grandfather     Social History Reviewed with no changes to be made today.   Outpatient Medications Prior to Visit  Medication Sig Dispense Refill  . amoxicillin  (AMOXIL) 500 MG capsule Take 1 capsule (500 mg total) by mouth 3 (three) times daily. 21 capsule 0  . azithromycin (ZITHROMAX) 250 MG tablet Take 4 tablets (1,000 mg total) by mouth once. 4 tablet 0  . cyclobenzaprine (FLEXERIL) 5 MG tablet 1 pill by mouth up to every 8 hours as needed. Start with one pill by mouth each bedtime as needed due to sedation 15 tablet 0  . doxycycline (VIBRAMYCIN) 100 MG capsule Take 1 capsule (100 mg total) by mouth 2 (two) times daily. 20 capsule 0  . HYDROcodone-acetaminophen (NORCO/VICODIN) 5-325 MG tablet Take 1 tablet by mouth every 6 (six) hours as needed. 10 tablet 0  . ibuprofen (ADVIL,MOTRIN) 600 MG tablet Take 1 tablet (600 mg total) by mouth every 6 (six) hours as needed. 30 tablet 0  . meloxicam (MOBIC) 7.5 MG tablet Take 1-2 tablets (7.5-15 mg total) by mouth daily as needed for pain. 30 tablet 0  . methocarbamol (ROBAXIN) 500 MG tablet Take 1 tablet (500 mg total) by mouth 2 (two) times daily. 20 tablet 0  . predniSONE (DELTASONE) 20 MG tablet 3 tabs po day one, then 2 tabs daily x 4 days 11 tablet 0   No facility-administered medications prior to visit.    No Known Allergies     Objective:    BP 121/76   Pulse 95   Resp 16   Ht 5\' 7"  (1.702 m)   Wt 198 lb (89.8 kg)   SpO2 97%   BMI 31.01 kg/m  Wt Readings from Last 3 Encounters:  12/05/20 198 lb (89.8 kg)  10/24/20 178 lb (80.7 kg)  08/05/20 198 lb 6.6 oz (90 kg)    Physical Exam Constitutional:      Appearance: He is well-developed.  HENT:     Head: Normocephalic and atraumatic.     Right Ear: Hearing, tympanic membrane, ear canal and external ear normal.     Left Ear: Hearing, tympanic membrane, ear canal and external ear normal.     Nose: Nose normal. No mucosal edema or rhinorrhea.     Mouth/Throat:     Pharynx: Uvula midline.     Tonsils: No tonsillar exudate. 1+ on the right. 1+ on the left.  Eyes:     General: Lids are normal. No scleral icterus.    Extraocular  Movements: Extraocular movements intact.     Conjunctiva/sclera: Conjunctivae normal.     Pupils: Pupils are equal, round, and reactive to light.  Neck:     Thyroid: No thyromegaly.     Trachea: No tracheal deviation.  Cardiovascular:     Rate and Rhythm: Normal rate and regular rhythm.     Heart sounds: Normal heart sounds. No murmur heard. No friction rub. No gallop.   Pulmonary:     Effort: Pulmonary effort is normal. No respiratory distress.     Breath sounds: Normal breath sounds. No wheezing or rales.  Chest:     Chest wall: No mass or tenderness.  Breasts:     Right: No inverted nipple, mass, nipple discharge, skin change or tenderness.     Left: No inverted nipple, mass, nipple discharge, skin change or tenderness.    Abdominal:     General: Bowel sounds are normal. There is no distension.     Palpations: Abdomen is soft. There is no mass.     Tenderness: There is no abdominal tenderness. There is no guarding or rebound.     Hernia: There is no hernia in the left inguinal area.  Genitourinary:    Penis: Normal.   Musculoskeletal:        General: No tenderness or deformity. Normal range of motion.     Cervical back: Normal range of motion and neck supple.  Lymphadenopathy:     Cervical: No cervical adenopathy.     Lower Body: No right inguinal adenopathy. No left inguinal adenopathy.  Skin:    General: Skin is warm and dry.     Capillary Refill: Capillary refill takes less than 2 seconds.     Findings: No erythema.  Neurological:     Mental Status: He is alert and oriented to person, place, and time.     Cranial Nerves: Cranial nerves are intact. No cranial nerve deficit.     Sensory: Sensation is intact.     Motor: Motor function is intact. No abnormal muscle tone.     Coordination: Coordination is intact. Coordination normal.     Deep Tendon Reflexes: Reflexes normal.     Reflex Scores:      Patellar reflexes are 1+ on the right side and 1+ on the left  side. Psychiatric:        Behavior: Behavior normal.        Thought Content: Thought content normal.        Judgment: Judgment normal.          Patient has been counseled extensively about nutrition and exercise as well as the importance of adherence with medications and regular follow-up. The patient was given clear instructions to go to ER or return to medical center if  symptoms don't improve, worsen or new problems develop. The patient verbalized understanding.   Follow-up: Return if symptoms worsen or fail to improve.   Claiborne Rigg, FNP-BC [facility] [place], [place] [phone]   12/05/2020, 3:21 PM

## 2021-01-12 ENCOUNTER — Other Ambulatory Visit: Payer: Self-pay

## 2021-01-12 ENCOUNTER — Emergency Department (HOSPITAL_COMMUNITY)
Admission: EM | Admit: 2021-01-12 | Discharge: 2021-01-12 | Disposition: A | Discharge status: Home / Self Care | Payer: 59 | Attending: Emergency Medicine | Admitting: Emergency Medicine

## 2021-01-12 ENCOUNTER — Emergency Department (HOSPITAL_COMMUNITY): Payer: 59

## 2021-01-12 ENCOUNTER — Encounter (HOSPITAL_COMMUNITY): Payer: Self-pay | Admitting: Emergency Medicine

## 2021-01-12 DIAGNOSIS — S0993XA Unspecified injury of face, initial encounter: Secondary | ICD-10-CM | POA: Diagnosis present

## 2021-01-12 DIAGNOSIS — J3489 Other specified disorders of nose and nasal sinuses: Secondary | ICD-10-CM | POA: Insufficient documentation

## 2021-01-12 DIAGNOSIS — Z23 Encounter for immunization: Secondary | ICD-10-CM | POA: Insufficient documentation

## 2021-01-12 DIAGNOSIS — S01511A Laceration without foreign body of lip, initial encounter: Secondary | ICD-10-CM | POA: Diagnosis not present

## 2021-01-12 DIAGNOSIS — F17210 Nicotine dependence, cigarettes, uncomplicated: Secondary | ICD-10-CM | POA: Insufficient documentation

## 2021-01-12 MED ORDER — LIDOCAINE HCL (PF) 1 % IJ SOLN
30.0000 mL | Freq: Once | INTRAMUSCULAR | Status: AC
  Administered 2021-01-12: 30 mL
  Filled 2021-01-12: qty 30

## 2021-01-12 MED ORDER — TETANUS-DIPHTH-ACELL PERTUSSIS 5-2.5-18.5 LF-MCG/0.5 IM SUSY
0.5000 mL | PREFILLED_SYRINGE | Freq: Once | INTRAMUSCULAR | Status: AC
  Administered 2021-01-12: 0.5 mL via INTRAMUSCULAR
  Filled 2021-01-12: qty 0.5
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
## 2021-01-12 NOTE — ED Notes (Signed)
Patient unable to sign D/T being in handcuffs, verbalized consent to triage and MSE.
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
## 2021-01-12 NOTE — ED Notes (Signed)
Went over discharge paperwork w pt, pt VS are WNL, pt did not have any questions, gave pt water, pt ready for discharge
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
## 2023-04-19 ENCOUNTER — Emergency Department (HOSPITAL_BASED_OUTPATIENT_CLINIC_OR_DEPARTMENT_OTHER)
Admission: EM | Admit: 2023-04-19 | Discharge: 2023-04-19 | Disposition: A | Discharge status: Home / Self Care | Payer: 59 | Attending: Emergency Medicine | Admitting: Emergency Medicine

## 2023-04-19 ENCOUNTER — Encounter (HOSPITAL_BASED_OUTPATIENT_CLINIC_OR_DEPARTMENT_OTHER): Payer: Self-pay | Admitting: Emergency Medicine

## 2023-04-19 ENCOUNTER — Other Ambulatory Visit: Payer: Self-pay

## 2023-04-19 DIAGNOSIS — W57XXXA Bitten or stung by nonvenomous insect and other nonvenomous arthropods, initial encounter: Secondary | ICD-10-CM | POA: Insufficient documentation

## 2023-04-19 DIAGNOSIS — X19XXXA Contact with other heat and hot substances, initial encounter: Secondary | ICD-10-CM | POA: Insufficient documentation

## 2023-04-19 DIAGNOSIS — T23121A Burn of first degree of single right finger (nail) except thumb, initial encounter: Secondary | ICD-10-CM | POA: Insufficient documentation

## 2023-04-19 DIAGNOSIS — F17200 Nicotine dependence, unspecified, uncomplicated: Secondary | ICD-10-CM | POA: Insufficient documentation

## 2023-04-19 DIAGNOSIS — S30861A Insect bite (nonvenomous) of abdominal wall, initial encounter: Secondary | ICD-10-CM | POA: Insufficient documentation

## 2023-04-19 DIAGNOSIS — T300 Burn of unspecified body region, unspecified degree: Secondary | ICD-10-CM

## 2023-04-19 LAB — CBC WITH DIFFERENTIAL/PLATELET
Abs Immature Granulocytes: 0.02 10*3/uL (ref 0.00–0.07)
Basophils Absolute: 0 10*3/uL (ref 0.0–0.1)
Basophils Relative: 0 %
Eosinophils Absolute: 0.2 10*3/uL (ref 0.0–0.5)
Eosinophils Relative: 3 %
HCT: 46.6 % (ref 39.0–52.0)
Hemoglobin: 15.5 g/dL (ref 13.0–17.0)
Immature Granulocytes: 0 %
Lymphocytes Relative: 41 %
Lymphs Abs: 2.9 10*3/uL (ref 0.7–4.0)
MCH: 26.6 pg (ref 26.0–34.0)
MCHC: 33.3 g/dL (ref 30.0–36.0)
MCV: 80.1 fL (ref 80.0–100.0)
Monocytes Absolute: 0.5 10*3/uL (ref 0.1–1.0)
Monocytes Relative: 8 %
Neutro Abs: 3.3 10*3/uL (ref 1.7–7.7)
Neutrophils Relative %: 48 %
Platelets: 286 10*3/uL (ref 150–400)
RBC: 5.82 MIL/uL — ABNORMAL HIGH (ref 4.22–5.81)
RDW: 15.1 % (ref 11.5–15.5)
WBC: 6.9 10*3/uL (ref 4.0–10.5)
nRBC: 0 % (ref 0.0–0.2)

## 2023-04-19 LAB — URINALYSIS, ROUTINE W REFLEX MICROSCOPIC
Bilirubin Urine: NEGATIVE
Glucose, UA: NEGATIVE mg/dL
Hgb urine dipstick: NEGATIVE
Ketones, ur: NEGATIVE mg/dL
Leukocytes,Ua: NEGATIVE
Nitrite: NEGATIVE
Specific Gravity, Urine: 1.029 (ref 1.005–1.030)
pH: 6.5 (ref 5.0–8.0)

## 2023-04-19 LAB — COMPREHENSIVE METABOLIC PANEL
ALT: 38 U/L (ref 0–44)
AST: 24 U/L (ref 15–41)
Albumin: 4.7 g/dL (ref 3.5–5.0)
Alkaline Phosphatase: 57 U/L (ref 38–126)
Anion gap: 8 (ref 5–15)
BUN: 10 mg/dL (ref 6–20)
CO2: 24 mmol/L (ref 22–32)
Calcium: 9.5 mg/dL (ref 8.9–10.3)
Chloride: 106 mmol/L (ref 98–111)
Creatinine, Ser: 0.92 mg/dL (ref 0.61–1.24)
GFR, Estimated: >60 mL/min (ref >60)
Glucose, Bld: 111 mg/dL — ABNORMAL HIGH (ref 70–99)
Potassium: 3.8 mmol/L (ref 3.5–5.1)
Sodium: 138 mmol/L (ref 135–145)
Total Bilirubin: 0.3 mg/dL (ref 0.3–1.2)
Total Protein: 7.7 g/dL (ref 6.5–8.1)

## 2023-04-19 LAB — LACTIC ACID, PLASMA: Lactic Acid, Venous: 1.1 mmol/L (ref 0.5–1.9)

## 2023-04-19 MED ORDER — SULFAMETHOXAZOLE-TRIMETHOPRIM 800-160 MG PO TABS: 1.0000 | ORAL_TABLET | Freq: Two times a day (BID) | ORAL | 0 refills | Status: AC

## 2023-04-19 NOTE — Discharge Instructions (Signed)
It was a pleasure caring for you today in the emergency department.  Please return to the emergency department for any worsening or worrisome symptoms.  Please avoid submersion of your right hand until the wound has totally healed, use thick rubber gloves if washing dishes. Return if wound looks worse, if you have discharge/pus from the wound, fever, worsening redness, etc

## 2023-04-19 NOTE — ED Triage Notes (Signed)
Pt arrives pov, steady gait, c/o burn to RT middle finger and insect bite on LLQ. PT endorse fever

## 2023-04-19 NOTE — ED Provider Notes (Signed)
[facility] AT [facility] Provider Note  CSN: [id number] Arrival date & time: 04/19/23 1706  Chief Complaint(s) Insect Bite  HPI Martin Frye is a 27 y.o. male with past medical history as below, significant for epididymitis, prior hernia repair who presents to the ED with complaint of burn/bug bite.   Pt reports was bitten on his abdomen by unknown bug around 1 wk ago, thinks perhaps a spider. It did have a small pustule on the surface that he popped and a slight amount of fluid was extracted. Feels the bite has improved over the past couple days. No sig abd pain, no n/v, no fevers or chills, no myalgias/arthralgias.   He also reports that he burned his finger around a week ago as well. No sig ROM limitations to his digits, no numbness or tingling to fingertips. Has been using peroxide to clean wound    Last tetanus 4/22  Past Medical History Past Medical History:  Diagnosis Date   Epididymitis    Patient Active Problem List   Diagnosis Date Noted   Swelling of right testicle 08/05/2020   Severe right groin pain 08/05/2020   Home Medication(s) Prior to Admission medications   Not on File                                                                                                                                    Past Surgical History Past Surgical History:  Procedure Laterality Date   HERNIA REPAIR     Family History Family History  Problem Relation Age of Onset   Hypertension Maternal Grandmother    Diabetes Maternal Grandmother    Diabetes Maternal Grandfather    Hypertension Maternal Grandfather     Social History Social History   Tobacco Use   Smoking status: Every Day    Types: Cigarettes   Smokeless tobacco: Never  Vaping Use   Vaping status: Every Day  Substance Use Topics   Alcohol use: Yes    Comment: occ   Drug use: Yes    Types: Marijuana   Allergies Patient has no known allergies.  Review of  Systems Review of Systems  Constitutional:  Negative for chills and fever.  Respiratory:  Negative for shortness of breath.   Cardiovascular:  Negative for chest pain.  Gastrointestinal:  Negative for abdominal pain, nausea and vomiting.  Musculoskeletal:  Negative for arthralgias and myalgias.  Skin:  Positive for wound.  All other systems reviewed and are negative.   Physical Exam Vital Signs  I have reviewed the triage vital signs BP (!) 148/93 (BP Location: Right Arm)   Pulse (!) 119   Temp 98.9 F (37.2 C) (Oral)   Resp 18   Ht 5\' 7"  (1.702 m)   Wt 90.7 kg   SpO2 99%   BMI 31.32 kg/m  Physical Exam Vitals and nursing note reviewed.  Constitutional:  General: He is not in acute distress.    Appearance: Normal appearance. He is well-developed. He is not ill-appearing.  HENT:     Head: Normocephalic and atraumatic.     Right Ear: External ear normal.     Left Ear: External ear normal.     Nose: Nose normal.     Mouth/Throat:     Mouth: Mucous membranes are moist.  Eyes:     General: No scleral icterus.       Right eye: No discharge.        Left eye: No discharge.  Cardiovascular:     Rate and Rhythm: Normal rate.  Pulmonary:     Effort: Pulmonary effort is normal. No respiratory distress.     Breath sounds: No stridor.  Abdominal:     General: Abdomen is flat. There is no distension.     Tenderness: There is no guarding.  Musculoskeletal:        General: No deformity.     Cervical back: No rigidity.  Skin:    General: Skin is warm and dry.     Coloration: Skin is not cyanotic, jaundiced or pale.       Neurological:     Mental Status: He is alert and oriented to person, place, and time.     GCS: GCS eye subscore is 4. GCS verbal subscore is 5. GCS motor subscore is 6.  Psychiatric:        Speech: Speech normal.        Behavior: Behavior normal. Behavior is cooperative.     ED Results and Treatments Labs (all labs ordered are listed, but only  abnormal results are displayed) Labs Reviewed  COMPREHENSIVE METABOLIC PANEL - Abnormal; Notable for the following components:      Result Value   Glucose, Bld 111 (*)    All other components within normal limits  CBC WITH DIFFERENTIAL/PLATELET - Abnormal; Notable for the following components:   RBC 5.82 (*)    All other components within normal limits  URINALYSIS, ROUTINE W REFLEX MICROSCOPIC - Abnormal; Notable for the following components:   Protein, ur TRACE (*)    All other components within normal limits  LACTIC ACID, PLASMA  LACTIC ACID, PLASMA                                                                                                                          Radiology No results found.  Pertinent labs & imaging results that were available during my care of the patient were reviewed by me and considered in my medical decision making (see MDM for details).  Medications Ordered in ED Medications - No data to display  Procedures Procedures  (including critical care time)  Medical Decision Making / ED Course    Medical Decision Making:    Martin Frye is a 27 y.o. male with hx as above here with concern for bug bite/finger burn. The complaint involves an extensive differential diagnosis and also carries with it a high risk of complications and morbidity.  Serious etiology was considered.   Complete initial physical exam performed, notably the patient  was NAD, sitting upright on stretcher, HDS.    Reviewed and confirmed nursing documentation for past medical history, family history, social history.  Vital signs reviewed.       Pt with presumed insect bite to abdomen, denies concern for tick exposure. Wound appears to be healing, will cover with empiric abx  Burn to right 4th digit, full ROM, no cellulitis, appears to be healing  appropriately, wound care provided  Advised close f/u with pcp  The patient improved significantly and was discharged in stable condition. Detailed discussions were had with the patient regarding current findings, and need for close f/u with PCP or on call doctor. The patient has been instructed to return immediately if the symptoms worsen in any way for re-evaluation. Patient verbalized understanding and is in agreement with current care plan. All questions answered prior to discharge.     Additional history obtained: -Additional history obtained from spouse -External records from outside source obtained and reviewed including: Chart review including previous notes, labs, imaging, consultation notes including prior ed visits, prior labs/imaging    Lab Tests: -I ordered, reviewed, and interpreted labs.   The pertinent results include:   Labs Reviewed  COMPREHENSIVE METABOLIC PANEL - Abnormal; Notable for the following components:      Result Value   Glucose, Bld 111 (*)    All other components within normal limits  CBC WITH DIFFERENTIAL/PLATELET - Abnormal; Notable for the following components:   RBC 5.82 (*)    All other components within normal limits  URINALYSIS, ROUTINE W REFLEX MICROSCOPIC - Abnormal; Notable for the following components:   Protein, ur TRACE (*)    All other components within normal limits  LACTIC ACID, PLASMA  LACTIC ACID, PLASMA    Notable for labs stable  EKG   EKG Interpretation Date/Time:    Ventricular Rate:    PR Interval:    QRS Duration:    QT Interval:    QTC Calculation:   R Axis:      Text Interpretation:           Imaging Studies ordered: na   Medicines ordered and prescription drug management: No orders of the defined types were placed in this encounter.   -I have reviewed the patients home medicines and have made adjustments as needed   Consultations Obtained: na   Cardiac Monitoring: Continuous pulse oximetry  99-100% on room air interpreted by myself   Social Determinants of Health:  Diagnosis or treatment significantly limited by social determinants of health: current smoker Counseled patient for approximately 3 minutes regarding smoking cessation. Discussed risks of smoking and how they applied and affected their visit here today.  CPT code: 41660: intermediate counseling for smoking cessation     Reevaluation: After the interventions noted above, I reevaluated the patient and found that they have stayed the same  Co morbidities that complicate the patient evaluation  Past Medical History:  Diagnosis Date   Epididymitis       Dispostion: Disposition decision including need for hospitalization was considered, and patient  discharged from emergency department.    Final Clinical Impression(s) / ED Diagnoses Final diagnoses:  Bug bite, initial encounter  Superficial burn     This chart was dictated using voice recognition software.  Despite best efforts to proofread,  errors can occur which can change the documentation meaning.    Sloan Leiter, DO 04/19/23 1904

## 2023-12-19 ENCOUNTER — Ambulatory Visit (HOSPITAL_COMMUNITY)
Admission: EM | Admit: 2023-12-19 | Discharge: 2023-12-19 | Disposition: A | Discharge status: Home / Self Care | Attending: Psychiatry | Admitting: Psychiatry

## 2023-12-19 DIAGNOSIS — Z760 Encounter for issue of repeat prescription: Secondary | ICD-10-CM | POA: Insufficient documentation

## 2023-12-19 DIAGNOSIS — F319 Bipolar disorder, unspecified: Secondary | ICD-10-CM | POA: Insufficient documentation

## 2023-12-19 DIAGNOSIS — T43596A Underdosing of other antipsychotics and neuroleptics, initial encounter: Secondary | ICD-10-CM | POA: Insufficient documentation

## 2023-12-19 DIAGNOSIS — Z79899 Other long term (current) drug therapy: Secondary | ICD-10-CM

## 2023-12-19 DIAGNOSIS — F4310 Post-traumatic stress disorder, unspecified: Secondary | ICD-10-CM | POA: Insufficient documentation

## 2023-12-19 DIAGNOSIS — F209 Schizophrenia, unspecified: Secondary | ICD-10-CM | POA: Insufficient documentation

## 2023-12-19 DIAGNOSIS — F909 Attention-deficit hyperactivity disorder, unspecified type: Secondary | ICD-10-CM | POA: Insufficient documentation

## 2023-12-19 DIAGNOSIS — Z91128 Patient's intentional underdosing of medication regimen for other reason: Secondary | ICD-10-CM | POA: Insufficient documentation

## 2023-12-19 MED ORDER — ARIPIPRAZOLE 5 MG PO TABS: 5.0000 mg | ORAL_TABLET | Freq: Every day | ORAL | 0 refills | Status: AC

## 2023-12-19 MED ORDER — HYDROXYZINE HCL 10 MG PO TABS: 10.0000 mg | ORAL_TABLET | Freq: Three times a day (TID) | ORAL | 0 refills | Status: AC | PRN

## 2023-12-19 NOTE — Discharge Summary (Signed)
 Criss Rosales to be D/C'd Home per NP order. An After Visit Summary was printed and given to the patient by provider. Patient escorted out and D/C home via private auto.  Dickie La  12/19/2023 1:48 PM

## 2023-12-19 NOTE — Discharge Instructions (Signed)
 You have been provided with one weeks' worth of Abilify 5 mg po. Take one per day. You've also been provided with Hydroxyzine 25 mg twice daily as needed for anxiety. Please present to the second floor of this building on Monday for establishment of care for medication management, as medication refills will not be completed at this location. If starting to experience suicide thoughts, homicidal thoughts or hearing voices or seeing things, please call [phone], come back to this location or  go to the nearest ER.
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
## 2023-12-19 NOTE — ED Provider Notes (Signed)
 [facility]al [facility] Urgent Care Medical Screening Exam  Patient Name: Martin Frye MRN: [id number] Date of Evaluation: 12/19/23 Chief Complaint:Medication management    Diagnosis:  Final diagnoses:  Medication management   History of Present illness: Martin Frye is a 28 y.o. male with prior reported mental diagnoses of MDD & bipolar d/o who presents to the UC today accompanied by his paternal aunt requesting medication management of his symptoms.  Assessment: During encounter, pt denies Suicidal ideations, denies homicidal ideations, denies current auditory or visual disturbances, denies paranoia, denies delusional thinking.  Denies first rank symptoms.  Reports that the last manic episode was 2 weeks ago, he is able to sit still during assessment, he is calm and cooperative, speech is coherent, clear, and he is speaking at a normal rate and rhythm.  There are no signs of overt psychosis, no manic type symptoms observed during encounter.  Patient reports that recently, his mood has been very erratic, with "periods of ups and downs", he reports bipolar disorder in his father, and paternal grandmother.  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  He reports trouble with concentration, reports appetite as excessive.  Denies other depressive symptoms, denies any history of hospitalizations that were mental health related, denies any suicide attempts in the past or recently.  Denies self-injurious behaviors.  He reports THC abuse, states that he smokes marijuana daily, but states that he is willing to stop smoking, if he can find a medication that will help to "relax my nerves".  Patient reports that he is looking to establish care today for medication management of his symptoms, and that the  trigger for this presentation was inability to sleep, and the anxiety symptoms listed above, as well as the manic episode that happened 2 weeks ago.  Patient reports that historically, Abilify was helpful.  Patient agreeable to coming back to this location, to the second floor on Monday, to establish care at [facility] for medication management.  Patient educated that should he start having suicidal ideations, hearing voices or seeing things, or having thoughts of wanting to hurt anybody else, or having other perceptual disturbances such as paranoia, he is to come back to this location, call [phone], [phone], or go to the nearest ER, to which patient and his aunt verbalized understanding.  Patient has been provided 5 days worth of prescription for hydroxyzine and Abilify, and is agreeable to coming [facility] to [facility] care in hopes of transitioning to Abilify long-acting injectable medication due to his poor compliance history.  Agreeable not to take medications while using substances of abuse such as THC, verbalizes understanding.  Educated on the need to seek help immediately if he experiences any symptoms that he thinks might be related to medications above such as dystonia.  Patient educated on EPS type symptoms, verbalizes understanding, agreeable to seek help should he experience any of the above.  Flowsheet Row ED from 12/19/2023 in [facility] ED from 04/19/2023 in [facility] Emergency Department at [facility] ED from 01/12/2021 in [facility] Emergency Department at [facility]  C-SSRS RISK CATEGORY No Risk No Risk No Risk       Psychiatric Specialty Exam  Presentation  General Appearance:Appropriate for Environment  Eye Contact:Fair  Speech:Clear and Coherent  Speech Volume:Normal  Handedness:Right   Mood and Affect  Mood:Depressed; Anxious  Affect:Congruent   Thought Process  Thought Processes:Coherent  Descriptions of  Associations:Intact  Orientation:Full (Time, Place and Person)  Thought Content:Logical    Hallucinations:None  Ideas of Reference:None  Suicidal Thoughts:No  Homicidal Thoughts:No   Sensorium  Memory:Immediate Fair  Judgment:Good  Insight:Good   Executive Functions  Concentration:Good  Attention Span:Good  Recall:Good  Fund of Knowledge:Good  Language:Good   Psychomotor Activity  Psychomotor Activity:Normal   Assets  Assets:Desire for Improvement; Resilience   Sleep  Sleep:Fair  Number of hours: No data recorded  Physical Exam: Physical Exam Vitals and nursing note reviewed.  Musculoskeletal:        General: Normal range of motion.  Neurological:     General: No focal deficit present.     Mental Status: He is oriented to person, place, and time.  Psychiatric:        Behavior: Behavior normal.        Thought Content: Thought content normal.        Judgment: Judgment normal.    Review of Systems  Psychiatric/Behavioral:  Positive for depression and substance abuse. Negative for hallucinations, memory loss and suicidal ideas. The patient is nervous/anxious and has insomnia.   All other systems reviewed and are negative.  Blood pressure 126/79, pulse 83, temperature 98.3 F (36.8 C), temperature source Oral, resp. rate 17, SpO2 98%. There is no height or weight on file to calculate BMI.  Musculoskeletal: Strength & Muscle Tone: within normal limits Gait & Station: normal Patient leans: N/A  [facility] MSE Discharge Disposition for Follow up and Recommendations: Based on my evaluation the patient does not appear to have an emergency medical condition and can be discharged with resources and follow up care in outpatient services for Medication Management and Individual Therapy [facility] to [facility] and agreeable to coming back there on 03/31 to establish care. Starleen Blue, NP 12/19/2023, 2:01 PM
